# Patient Record
Sex: Female | Born: 1945 | Race: Asian | Hispanic: No | Marital: Married | Smoking: Never smoker
Health system: Southern US, Community
[De-identification: ages and names within clinical notes are randomized; demographics above are authoritative.]

## PROBLEM LIST (undated history)

## (undated) DIAGNOSIS — E119 Type 2 diabetes mellitus without complications: Secondary | ICD-10-CM

## (undated) DIAGNOSIS — I10 Essential (primary) hypertension: Secondary | ICD-10-CM

---

## 2014-04-18 ENCOUNTER — Encounter (HOSPITAL_COMMUNITY): Payer: Self-pay | Admitting: Emergency Medicine

## 2014-04-18 DIAGNOSIS — K219 Gastro-esophageal reflux disease without esophagitis: Secondary | ICD-10-CM | POA: Insufficient documentation

## 2014-04-18 DIAGNOSIS — I10 Essential (primary) hypertension: Secondary | ICD-10-CM | POA: Diagnosis not present

## 2014-04-18 DIAGNOSIS — K8020 Calculus of gallbladder without cholecystitis without obstruction: Secondary | ICD-10-CM | POA: Insufficient documentation

## 2014-04-18 DIAGNOSIS — R1013 Epigastric pain: Secondary | ICD-10-CM | POA: Insufficient documentation

## 2014-04-18 DIAGNOSIS — E119 Type 2 diabetes mellitus without complications: Secondary | ICD-10-CM | POA: Diagnosis not present

## 2014-04-18 DIAGNOSIS — R1011 Right upper quadrant pain: Secondary | ICD-10-CM | POA: Diagnosis not present

## 2014-04-18 LAB — CBC WITH DIFFERENTIAL/PLATELET
Basophils Absolute: 0 10*3/uL (ref 0.0–0.1)
Basophils Relative: 0 % (ref 0–1)
EOS ABS: 0.1 10*3/uL (ref 0.0–0.7)
Eosinophils Relative: 2 % (ref 0–5)
HCT: 34.4 % — ABNORMAL LOW (ref 36.0–46.0)
Hemoglobin: 11.3 g/dL — ABNORMAL LOW (ref 12.0–15.0)
LYMPHS PCT: 46 % (ref 12–46)
Lymphs Abs: 3.2 10*3/uL (ref 0.7–4.0)
MCH: 30.5 pg (ref 26.0–34.0)
MCHC: 32.8 g/dL (ref 30.0–36.0)
MCV: 92.7 fL (ref 78.0–100.0)
Monocytes Absolute: 0.5 10*3/uL (ref 0.1–1.0)
Monocytes Relative: 7 % (ref 3–12)
NEUTROS PCT: 45 % (ref 43–77)
Neutro Abs: 3.1 10*3/uL (ref 1.7–7.7)
PLATELETS: 276 10*3/uL (ref 150–400)
RBC: 3.71 MIL/uL — ABNORMAL LOW (ref 3.87–5.11)
RDW: 13.1 % (ref 11.5–15.5)
WBC: 6.9 10*3/uL (ref 4.0–10.5)

## 2014-04-18 LAB — COMPREHENSIVE METABOLIC PANEL
ALT: 18 U/L (ref 0–35)
AST: 23 U/L (ref 0–37)
Albumin: 4.3 g/dL (ref 3.5–5.2)
Alkaline Phosphatase: 54 U/L (ref 39–117)
Anion gap: 16 — ABNORMAL HIGH (ref 5–15)
BUN: 15 mg/dL (ref 6–23)
CALCIUM: 9.7 mg/dL (ref 8.4–10.5)
CO2: 26 mEq/L (ref 19–32)
Chloride: 101 mEq/L (ref 96–112)
Creatinine, Ser: 0.93 mg/dL (ref 0.50–1.10)
GFR calc Af Amer: 73 mL/min — ABNORMAL LOW (ref >90)
GFR calc non Af Amer: 63 mL/min — ABNORMAL LOW (ref >90)
Glucose, Bld: 85 mg/dL (ref 70–99)
POTASSIUM: 3.4 meq/L — AB (ref 3.7–5.3)
SODIUM: 143 meq/L (ref 137–147)
TOTAL PROTEIN: 7.8 g/dL (ref 6.0–8.3)
Total Bilirubin: 0.4 mg/dL (ref 0.3–1.2)

## 2014-04-18 LAB — URINALYSIS, ROUTINE W REFLEX MICROSCOPIC
Bilirubin Urine: NEGATIVE
Glucose, UA: NEGATIVE mg/dL
HGB URINE DIPSTICK: NEGATIVE
Ketones, ur: 15 mg/dL — AB
Leukocytes, UA: NEGATIVE
NITRITE: NEGATIVE
PROTEIN: NEGATIVE mg/dL
SPECIFIC GRAVITY, URINE: 1.009 (ref 1.005–1.030)
UROBILINOGEN UA: 1 mg/dL (ref 0.0–1.0)
pH: 7 (ref 5.0–8.0)

## 2014-04-18 LAB — LIPASE, BLOOD: Lipase: 39 U/L (ref 11–59)

## 2014-04-18 NOTE — ED Notes (Signed)
The pt is c/o abd pain for 3 weeks.  Sometimes she has diarrhea

## 2014-04-19 ENCOUNTER — Emergency Department (HOSPITAL_COMMUNITY): Payer: Medicaid - Out of State

## 2014-04-19 ENCOUNTER — Emergency Department (HOSPITAL_COMMUNITY)
Admission: EM | Admit: 2014-04-19 | Discharge: 2014-04-19 | Disposition: A | Discharge status: Home / Self Care | Payer: Medicaid - Out of State | Attending: Emergency Medicine | Admitting: Emergency Medicine

## 2014-04-19 DIAGNOSIS — K8020 Calculus of gallbladder without cholecystitis without obstruction: Secondary | ICD-10-CM

## 2014-04-19 DIAGNOSIS — R1013 Epigastric pain: Secondary | ICD-10-CM

## 2014-04-19 HISTORY — DX: Essential (primary) hypertension: I10

## 2014-04-19 HISTORY — DX: Type 2 diabetes mellitus without complications: E11.9

## 2014-04-19 LAB — I-STAT TROPONIN, ED: Troponin i, poc: 0 ng/mL (ref 0.00–0.08)

## 2014-04-19 MED ORDER — GI COCKTAIL ~~LOC~~
30.0000 mL | Freq: Once | ORAL | Status: AC
  Administered 2014-04-19: 30 mL via ORAL
  Filled 2014-04-19: qty 30

## 2014-04-19 MED ORDER — HYDROCODONE-ACETAMINOPHEN 5-325 MG PO TABS
1.0000 | ORAL_TABLET | Freq: Once | ORAL | Status: AC
  Administered 2014-04-19: 1 via ORAL
  Filled 2014-04-19: qty 1

## 2014-04-19 MED ORDER — SODIUM CHLORIDE 0.9 % IV BOLUS (SEPSIS)
500.0000 mL | Freq: Once | INTRAVENOUS | Status: AC
  Administered 2014-04-19: 500 mL via INTRAVENOUS

## 2014-04-19 MED ORDER — HYDROCODONE-ACETAMINOPHEN 5-325 MG PO TABS: 1.0000 | ORAL_TABLET | ORAL | Status: AC | PRN

## 2014-04-19 MED ORDER — FAMOTIDINE 20 MG PO TABS
20.0000 mg | ORAL_TABLET | Freq: Once | ORAL | Status: AC
  Administered 2014-04-19: 20 mg via ORAL
  Filled 2014-04-19: qty 1

## 2014-04-19 MED ORDER — ONDANSETRON HCL 4 MG/2ML IJ SOLN
4.0000 mg | Freq: Once | INTRAMUSCULAR | Status: AC
  Administered 2014-04-19: 4 mg via INTRAVENOUS
  Filled 2014-04-19: qty 2

## 2014-04-19 MED ORDER — ONDANSETRON 4 MG PO TBDP: ORAL_TABLET | ORAL | Status: AC

## 2014-04-19 NOTE — Discharge Instructions (Signed)
If you were given medicines take as directed.  If you are on coumadin or contraceptives realize their levels and effectiveness is altered by many different medicines.  If you have any reaction (rash, tongues swelling, other) to the medicines stop taking and see a physician.   Please follow up as directed and return to the ER or see a physician for new or worsening symptoms.  Thank you. Filed Vitals:   04/19/14 0130 04/19/14 0137 04/19/14 0215 04/19/14 0330  BP: 124/52   105/38  Pulse: 83  68 73  Temp:  98.3 F (36.8 C)    TempSrc:  Oral    Resp: Height:      Weight:      SpO2: 98%  99% 98%    Biliary Colic  Biliary colic is a steady or irregular pain in the upper abdomen. It is usually under the right side of the rib cage. It happens when gallstones interfere with the normal flow of bile from the gallbladder. Bile is a liquid that helps to digest fats. Bile is made in the liver and stored in the gallbladder. When you eat a meal, bile passes from the gallbladder through the cystic duct and the common bile duct into the small intestine. There, it mixes with partially digested food. If a gallstone blocks either of these ducts, the normal flow of bile is blocked. The muscle cells in the bile duct contract forcefully to try to move the stone. This causes the pain of biliary colic.  SYMPTOMS   A person with biliary colic usually complains of pain in the upper abdomen. This pain can be:  In the center of the upper abdomen just below the breastbone.  In the upper-right part of the abdomen, near the gallbladder and liver.  Spread back toward the right shoulder blade.  Nausea and vomiting.  The pain usually occurs after eating.  Biliary colic is usually triggered by the digestive system's demand for bile. The demand for bile is high after fatty meals. Symptoms can also occur when a person who has been fasting suddenly eats a very large meal. Most episodes of biliary colic pass after 1  to 5 hours. After the most intense pain passes, your abdomen may continue to ache mildly for about 24 hours. DIAGNOSIS  After you describe your symptoms, your caregiver will perform a physical exam. He or she will pay attention to the upper right portion of your belly (abdomen). This is the area of your liver and gallbladder. An ultrasound will help your caregiver look for gallstones. Specialized scans of the gallbladder may also be done. Blood tests may be done, especially if you have fever or if your pain persists. PREVENTION  Biliary colic can be prevented by controlling the risk factors for gallstones. Some of these risk factors, such as heredity, increasing age, and pregnancy are a normal part of life. Obesity and a high-fat diet are risk factors you can change through a healthy lifestyle. Women going through menopause who take hormone replacement therapy (estrogen) are also more likely to develop biliary colic. TREATMENT   Pain medication may be prescribed.  You may be encouraged to eat a fat-free diet.  If the first episode of biliary colic is severe, or episodes of colic keep retuning, surgery to remove the gallbladder (cholecystectomy) is usually recommended. This procedure can be done through small incisions using an instrument called a laparoscope. The procedure often requires a brief stay in the hospital. Some  people can leave the hospital the same day. It is the most widely used treatment in people troubled by painful gallstones. It is effective and safe, with no complications in more than 90% of cases.  If surgery cannot be done, medication that dissolves gallstones may be used. This medication is expensive and can take months or years to work. Only small stones will dissolve.  Rarely, medication to dissolve gallstones is combined with a procedure called shock-wave lithotripsy. This procedure uses carefully aimed shock waves to break up gallstones. In many people treated with this  procedure, gallstones form again within a few years. PROGNOSIS  If gallstones block your cystic duct or common bile duct, you are at risk for repeated episodes of biliary colic. There is also a 25% chance that you will develop a gallbladder infection(acute cholecystitis), or some other complication of gallstones within 10 to 20 years. If you have surgery, schedule it at a time that is convenient for you and at a time when you are not sick. HOME CARE INSTRUCTIONS   Drink plenty of clear fluids.  Avoid fatty, greasy or fried foods, or any foods that make your pain worse.  Take medications as directed. SEEK MEDICAL CARE IF:   You develop a fever over 100.5 F (38.1 C).  Your pain gets worse over time.  You develop nausea that prevents you from eating and drinking.  You develop vomiting. SEEK IMMEDIATE MEDICAL CARE IF:   You have continuous or severe belly (abdominal) pain which is not relieved with medications.  You develop nausea and vomiting which is not relieved with medications.  You have symptoms of biliary colic and you suddenly develop a fever and shaking chills. This may signal cholecystitis. Call your caregiver immediately.  You develop a yellow color to your skin or the white part of your eyes (jaundice). Document Released: 01/02/2006 Document Revised: 10/24/2011 Document Reviewed: 03/13/2008 [facility] Patient Information 2015 [place], [place]. This information is not intended to replace advice given to you by your health care provider. Make sure you discuss any questions you have with your health care provider.

## 2014-04-19 NOTE — ED Provider Notes (Signed)
CSN: [id number]     Arrival date & time 04/18/14  2050 History   First MD Initiated Contact with Patient 04/19/14 0124     Chief Complaint  Patient presents with  . Abdominal Pain     (Consider location/radiation/quality/duration/timing/severity/associated sxs/prior Treatment) HPI Comments: 68 year old female with high blood pressure and diabetes, recently moved from [place] with family presents with 4-5 weeks of epigastric and right upper quadrant pain worse with eating. Mild burning sensation. Patient has reflux history, no known ulcer or gallbladder disease. Patient denies chest pain or shortness of breath. Patient denies blood in the stools however has had mild diarrhea and vomiting. Pain intermittent. No specific radiation.  Patient is a 68 y.o. female presenting with abdominal pain. The history is provided by the patient.  Abdominal Pain Associated symptoms: diarrhea, nausea and vomiting   Associated symptoms: no chest pain, no chills, no dysuria, no fever and no shortness of breath     Past Medical History  Diagnosis Date  . Diabetes mellitus without complication   . Hypertension    History reviewed. No pertinent past surgical history. No family history on file. History  Substance Use Topics  . Smoking status: Never Smoker   . Smokeless tobacco: Not on file  . Alcohol Use: No   OB History   Grav Para Term Preterm Abortions TAB SAB Ect Mult Living                 Review of Systems  Constitutional: Positive for appetite change. Negative for fever and chills.  HENT: Negative for congestion.   Eyes: Negative for visual disturbance.  Respiratory: Negative for shortness of breath.   Cardiovascular: Negative for chest pain.  Gastrointestinal: Positive for nausea, vomiting, abdominal pain and diarrhea.  Genitourinary: Negative for dysuria and flank pain.  Musculoskeletal: Negative for back pain, neck pain and neck stiffness.  Skin: Negative for rash.  Neurological:  Negative for light-headedness and headaches.      Allergies  Review of patient's allergies indicates no known allergies.  Home Medications   Prior to Admission medications   Medication Sig Start Date End Date Taking? Authorizing Provider  amLODipine (NORVASC) 5 MG tablet Take 5 mg by mouth daily.   Yes Historical Provider, MD  calcium-vitamin D (OSCAL WITH D) 500-200 MG-UNIT per tablet Take 1 tablet by mouth 2 (two) times daily. For one month   Yes Historical Provider, MD  Cholecalciferol (VITAMIN D) 2000 UNITS CAPS Take 2,000 Units by mouth daily. For one month   Yes Historical Provider, MD  glipiZIDE (GLUCOTROL XL) 2.5 MG 24 hr tablet Take 2.5 mg by mouth daily with breakfast.   Yes Historical Provider, MD  losartan-hydrochlorothiazide (HYZAAR) 100-25 MG per tablet Take 1 tablet by mouth daily.   Yes Historical Provider, MD  metFORMIN (GLUCOPHAGE) 1000 MG tablet Take 1,000 mg by mouth 2 (two) times daily with a meal.   Yes Historical Provider, MD  omeprazole (PRILOSEC) 20 MG capsule Take 20 mg by mouth daily.   Yes Historical Provider, MD   BP 124/52  Pulse 68  Temp(Src) 98.3 F (36.8 C) (Oral)  Resp 19  Ht  (1.575 m)  Wt 162 lb (73.483 kg)  BMI 29.62 kg/m2  SpO2 99% Physical Exam  Nursing note and vitals reviewed. Constitutional: She is oriented to person, place, and time. She appears well-developed and well-nourished.  HENT:  Head: Normocephalic and atraumatic.  Mild dry mucous membranes  Eyes: Conjunctivae are normal. Right eye exhibits no discharge.  Left eye exhibits no discharge.  Neck: Normal range of motion. Neck supple. No tracheal deviation present.  Cardiovascular: Normal rate and regular rhythm.   Pulmonary/Chest: Effort normal and breath sounds normal.  Abdominal: Soft. She exhibits no distension. There is tenderness (mild epigastric right upper quadrant). There is no guarding.  Musculoskeletal: She exhibits no edema.  Neurological: She is alert and  oriented to person, place, and time.  Skin: Skin is warm. No rash noted.  Psychiatric: She has a normal mood and affect.    ED Course  Procedures (including critical care time) Labs Review Labs Reviewed  URINALYSIS, ROUTINE W REFLEX MICROSCOPIC - Abnormal; Notable for the following:    Ketones, ur 15 (*)    All other components within normal limits  CBC WITH DIFFERENTIAL - Abnormal; Notable for the following:    RBC 3.71 (*)    Hemoglobin 11.3 (*)    HCT 34.4 (*)    All other components within normal limits  COMPREHENSIVE METABOLIC PANEL - Abnormal; Notable for the following:    Potassium 3.4 (*)    GFR calc non Af Amer 63 (*)    GFR calc Af Amer 73 (*)    Anion gap 16 (*)    All other components within normal limits  LIPASE, BLOOD  I-STAT TROPOININ, ED    Imaging Review US Abdomen Complete  04/19/2014   CLINICAL DATA:  Right upper quadrant pain and vomiting.  EXAM: ULTRASOUND ABDOMEN COMPLETE  COMPARISON:  None.  FINDINGS: Gallbladder:  1.4 cm nonmobile stone in the gallbladder neck. Gallbladder is not distended. No wall thickening or edema. Murphy's sign is negative.  Common bile duct:  Diameter: 7.8 mm, mildly enlarged. No intraductal stones identified.  Liver:  Mild diffusely increased echotexture suggesting diffuse fatty infiltration.  IVC:  No abnormality visualized.  Pancreas:  Poor visualization due to overlying bowel gas.  Spleen:  Size and appearance within normal limits.  Right Kidney:  Length: 11 cm. Echogenicity within normal limits. No mass or hydronephrosis visualized.  Left Kidney:  Length: 10.1 cm. Echogenicity within normal limits. No mass or hydronephrosis visualized.  Abdominal aorta:  No aneurysm visualized.  Other findings:  None.  IMPRESSION: Cholelithiasis with single stone in the gallbladder neck. No associated inflammatory changes in the gallbladder. Diffuse fatty infiltration of the liver. Extrahepatic bile duct measures 7.8 mm, mildly enlarged.   Electronically  Signed   By: Burman Nieves M.D.   On: 04/19/2014 03:14     EKG Interpretation   Date/Time:  Saturday April 19 2014 02:02:24 EDT Ventricular Rate:  75 PR Interval:  170 QRS Duration: 92 QT Interval:  412 QTC Calculation: 460 R Axis:   84 Text Interpretation:  Sinus rhythm Borderline right axis deviation  Borderline T wave abnormalities Confirmed by Elnor Renovato  MD, Billyjoe Go ([id number]) on  04/19/2014 3:16:23 AM      MDM   Final diagnoses:  Symptomatic cholelithiasis  Epigastric pain   Well-appearing female, translated by daughter presents with recurrent abdominal pain for 4-5 weeks with vomiting and diarrhea. Discussed clinically reflux versus biliary versus other. Cardiac screen with epigastric pain and nausea an older female. Patient's symptoms improved with GI medicines. Blood work unremarkable mild anemia. Fluids given in ER, ultrasound pending.  Pain controlled on recheck. Plan for followup outpatient general surgery to discuss cholecystectomy.  Results and differential diagnosis were discussed with the patient/parent/guardian. Close follow up outpatient was discussed, comfortable with the plan.   Medications  HYDROcodone-acetaminophen (NORCO/VICODIN) 5-325 MG per  tablet 1 tablet (not administered)  famotidine (PEPCID) tablet 20 mg (20 mg Oral Given 04/19/14 0208)  gi cocktail (Maalox,Lidocaine,Donnatal) (30 mLs Oral Given 04/19/14 0208)  sodium chloride 0.9 % bolus 500 mL (0 mLs Intravenous Stopped 04/19/14 0311)  ondansetron (ZOFRAN) injection 4 mg (4 mg Intravenous Given 04/19/14 0208)    Filed Vitals:   04/19/14 0130 04/19/14 0137 04/19/14 0215 04/19/14 0330  BP: 124/52   105/38  Pulse: 83  68 73  Temp:  98.3 F (36.8 C)    TempSrc:  Oral    Resp: Height:      Weight:      SpO2: 98%  99% 98%        Enid Skeens, MD 04/19/14 0522

## 2014-05-05 ENCOUNTER — Ambulatory Visit: Payer: Medicaid - Out of State | Admitting: Internal Medicine

## 2015-08-04 IMAGING — US US ABDOMEN COMPLETE
1 series · 14 of 25 positions shown · non-contrast
Comparison: None.

CLINICAL DATA: Right upper quadrant pain and vomiting.

EXAM:
ULTRASOUND ABDOMEN COMPLETE

[Series 1: us abdomen complete · 0.23mm/px · 14 of 50 slices shown]
[im 1/50]
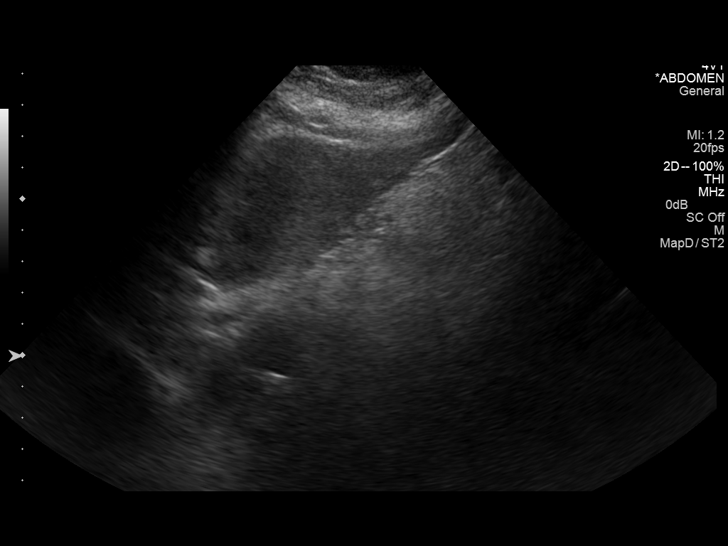
[im 5/50]
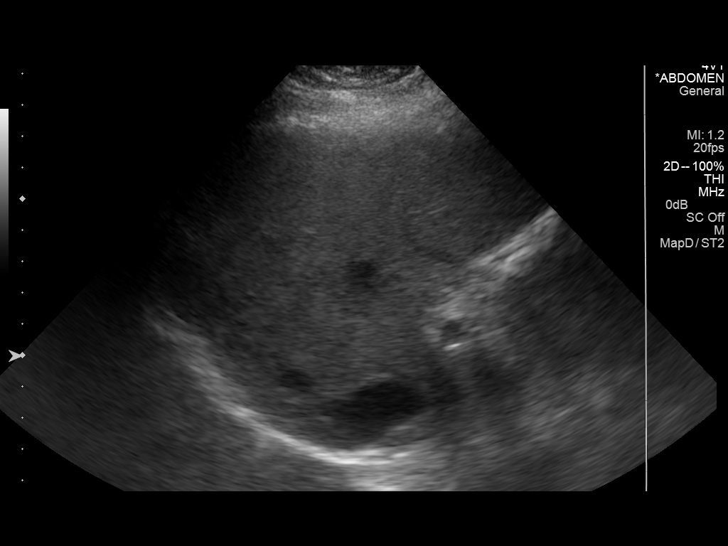
[im 9/50]
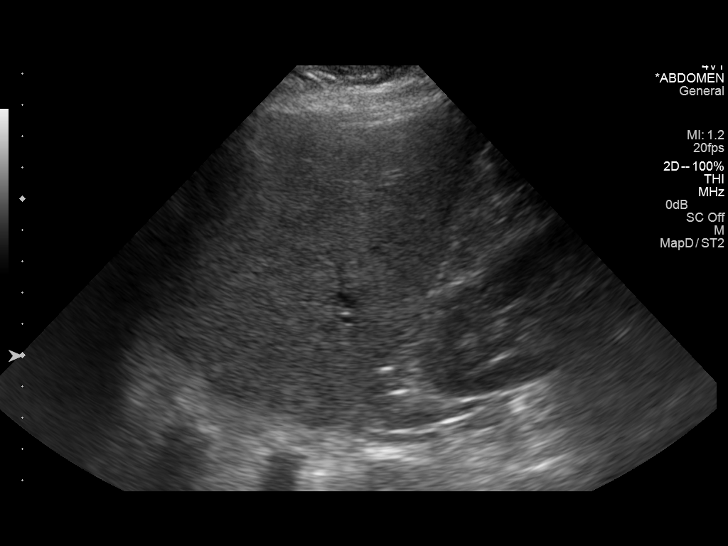
[im 13/50]
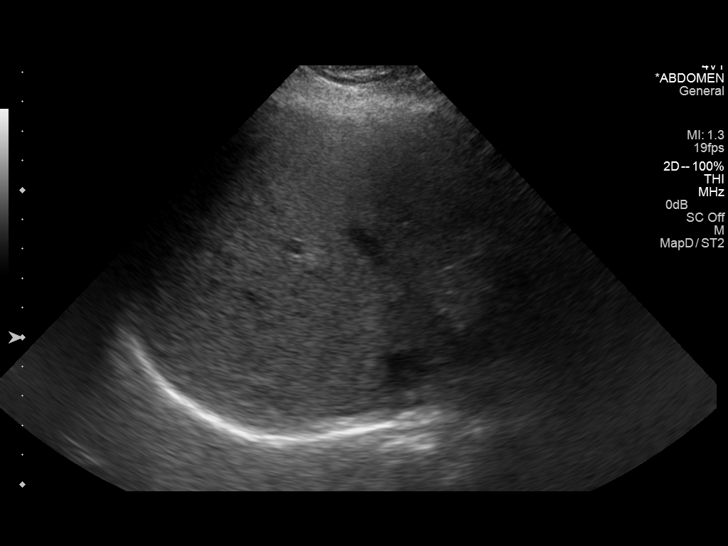
[im 17/50]
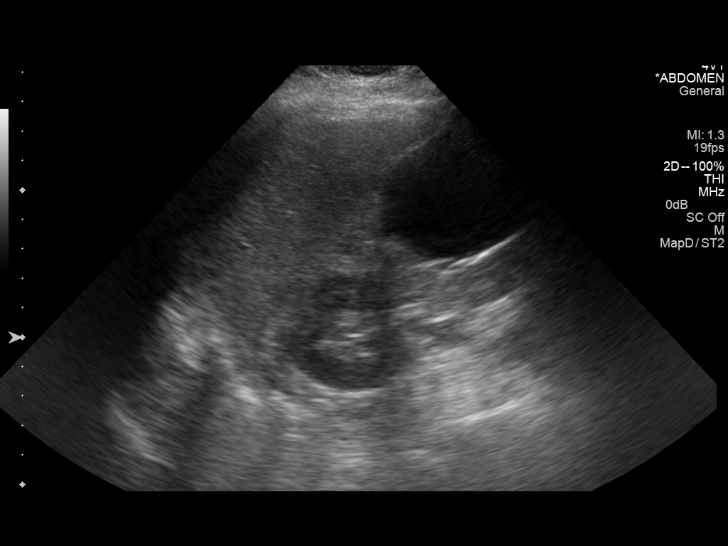
[im 19/50]
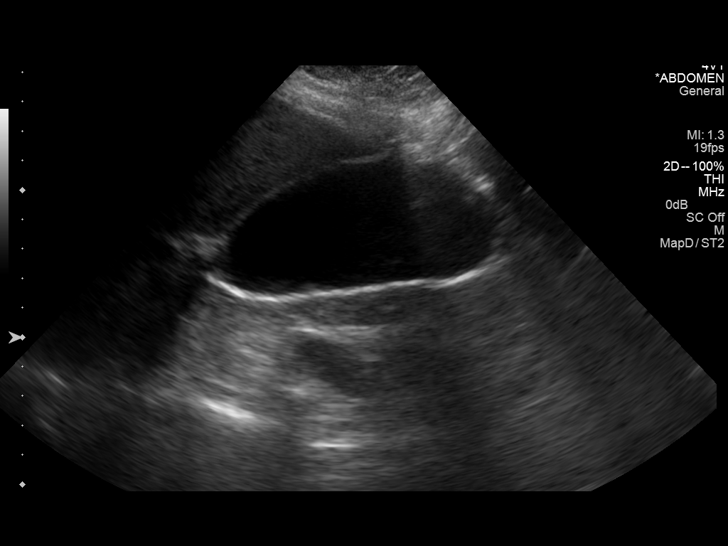
[im 23/50]
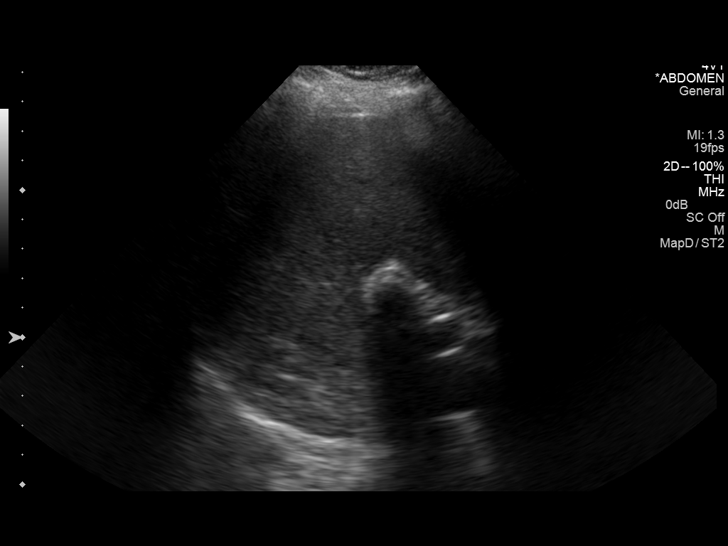
[im 27/50]
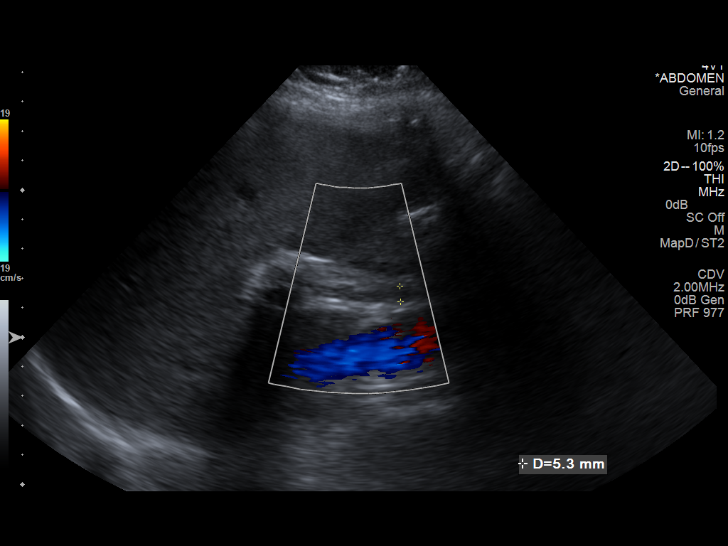
[im 31/50]
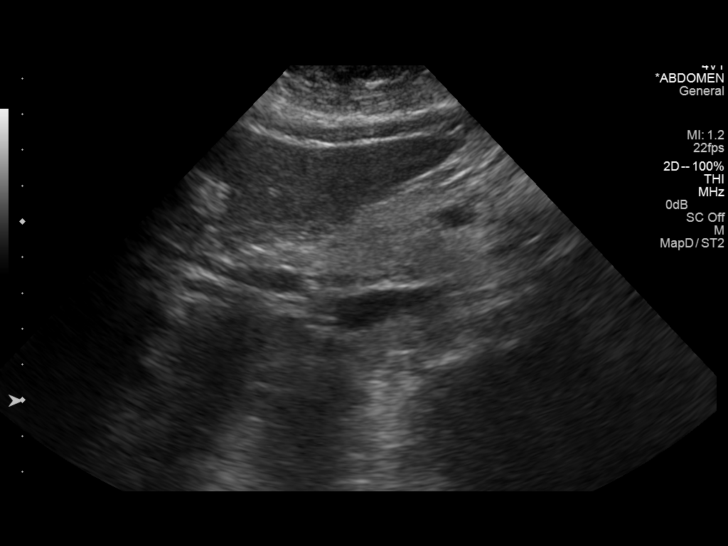
[im 33/50]
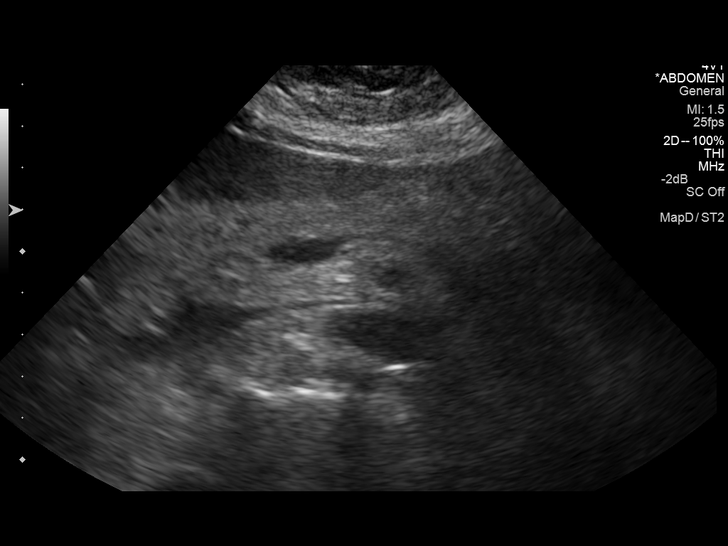
[im 37/50]
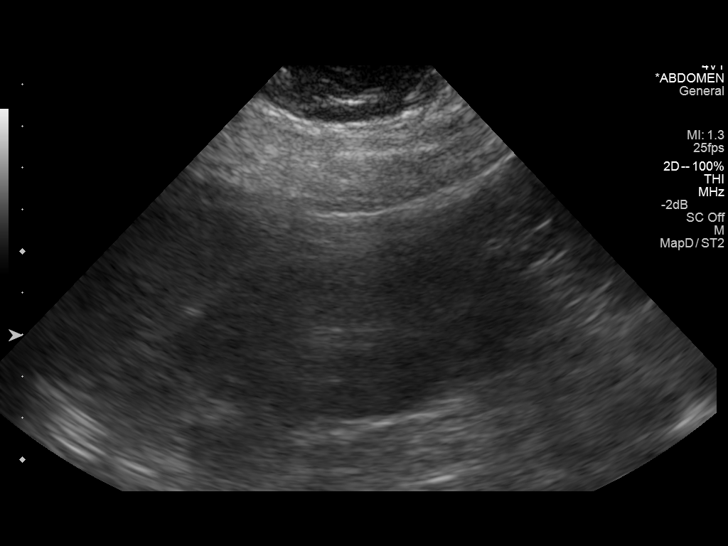
[im 41/50]
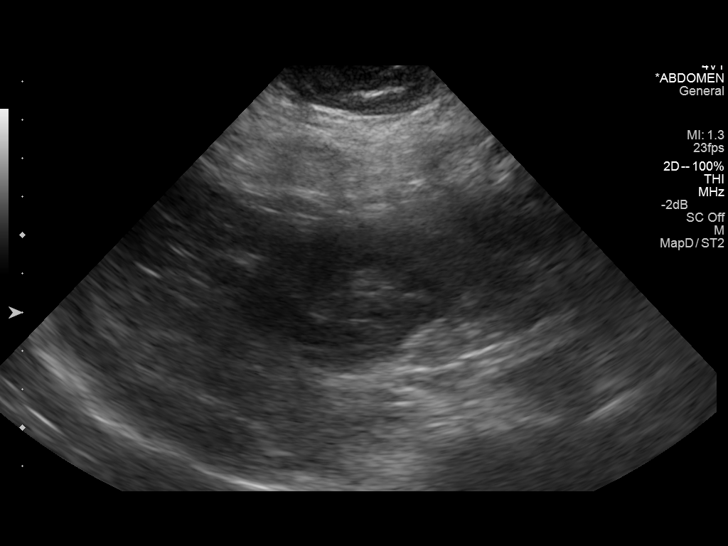
[im 45/50]
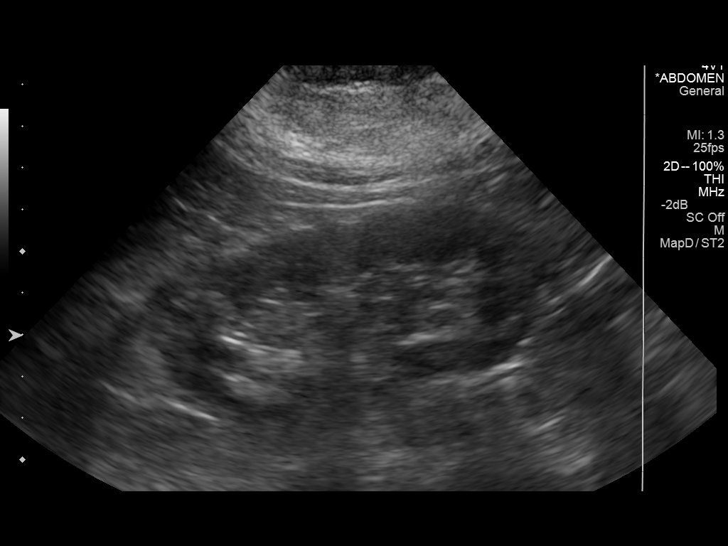
[im 50/50]
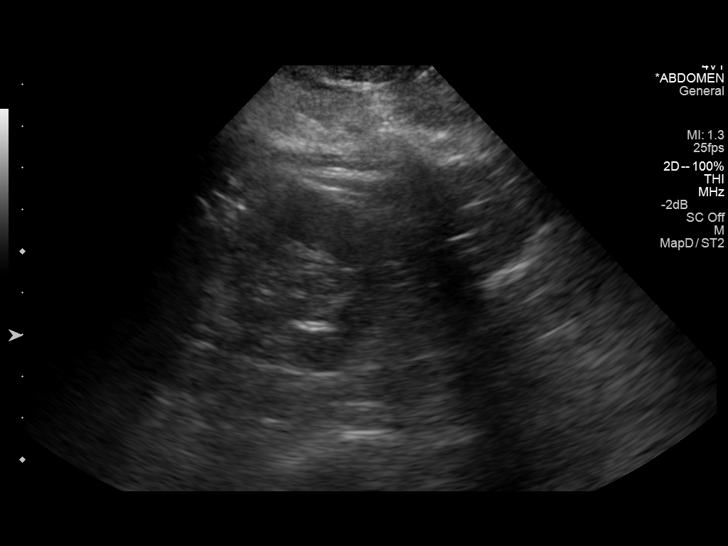

[14 of 25 positions shown; findings below may reference images not displayed]

FINDINGS: Gallbladder:

1.4 cm nonmobile stone in the gallbladder neck. Gallbladder is not
distended. No wall thickening or edema. Murphy's sign is negative.

Common bile duct:

Diameter: 7.8 mm, mildly enlarged. No intraductal stones identified.

Liver:

Mild diffusely increased echotexture suggesting diffuse fatty
infiltration.

IVC:

No abnormality visualized.

Pancreas:

Poor visualization due to overlying bowel gas.

Spleen:

Size and appearance within normal limits.

Right Kidney:

Length: 11 cm. Echogenicity within normal limits. No mass or
hydronephrosis visualized.

Left Kidney:

Length: 10.1 cm. Echogenicity within normal limits. No mass or
hydronephrosis visualized.

Abdominal aorta:

No aneurysm visualized.

Other findings:

None.
IMPRESSION: Cholelithiasis with single stone in the gallbladder neck. No
associated inflammatory changes in the gallbladder. Diffuse fatty
infiltration of the liver. Extrahepatic bile duct measures 7.8 mm,
mildly enlarged.
